# Patient Record
Sex: Male | Born: 1990 | Race: Black or African American | Hispanic: No | Marital: Single | Smoking: Current some day smoker
Health system: Southern US, Community
[De-identification: ages and names within clinical notes are randomized; demographics above are authoritative.]

## PROBLEM LIST (undated history)

## (undated) DIAGNOSIS — R0981 Nasal congestion: Secondary | ICD-10-CM

## (undated) DIAGNOSIS — B20 Human immunodeficiency virus [HIV] disease: Secondary | ICD-10-CM

---
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
## 2009-07-29 ENCOUNTER — Emergency Department (HOSPITAL_COMMUNITY): Admission: EM | Admit: 2009-07-29 | Discharge: 2009-07-29 | Payer: Self-pay | Admitting: Emergency Medicine

## 2010-01-15 ENCOUNTER — Emergency Department (HOSPITAL_COMMUNITY): Admission: EM | Admit: 2010-01-15 | Discharge: 2010-01-15 | Payer: Self-pay | Admitting: Emergency Medicine

## 2010-07-20 ENCOUNTER — Emergency Department (HOSPITAL_COMMUNITY): Admission: EM | Admit: 2010-07-20 | Discharge: 2010-07-20 | Payer: Self-pay | Admitting: Emergency Medicine

## 2011-06-08 LAB — URINALYSIS, ROUTINE W REFLEX MICROSCOPIC
Glucose, UA: NEGATIVE
Hgb urine dipstick: NEGATIVE
Ketones, ur: >80 — AB
Protein, ur: NEGATIVE

## 2014-07-11 ENCOUNTER — Encounter (HOSPITAL_COMMUNITY): Payer: Self-pay | Admitting: Emergency Medicine

## 2014-07-11 ENCOUNTER — Emergency Department (HOSPITAL_COMMUNITY): Payer: Federal, State, Local not specified - PPO

## 2014-07-11 ENCOUNTER — Encounter (HOSPITAL_COMMUNITY)
Admission: EM | Disposition: A | Discharge status: Home / Self Care | Payer: Self-pay | Source: Home / Self Care | Attending: Otolaryngology

## 2014-07-11 ENCOUNTER — Inpatient Hospital Stay (HOSPITAL_COMMUNITY): Payer: Federal, State, Local not specified - PPO | Admitting: Anesthesiology

## 2014-07-11 ENCOUNTER — Encounter (HOSPITAL_COMMUNITY): Payer: Federal, State, Local not specified - PPO | Admitting: Anesthesiology

## 2014-07-11 ENCOUNTER — Inpatient Hospital Stay (HOSPITAL_COMMUNITY)
Admission: EM | Admit: 2014-07-11 | Discharge: 2014-07-12 | DRG: 132 | Disposition: A | Discharge status: Home / Self Care | Payer: Federal, State, Local not specified - PPO | Attending: Otolaryngology | Admitting: Otolaryngology

## 2014-07-11 DIAGNOSIS — Z21 Asymptomatic human immunodeficiency virus [HIV] infection status: Secondary | ICD-10-CM | POA: Diagnosis present

## 2014-07-11 DIAGNOSIS — T1490 Injury, unspecified: Secondary | ICD-10-CM | POA: Diagnosis present

## 2014-07-11 DIAGNOSIS — S02609B Fracture of mandible, unspecified, initial encounter for open fracture: Secondary | ICD-10-CM | POA: Diagnosis present

## 2014-07-11 DIAGNOSIS — S52122A Displaced fracture of head of left radius, initial encounter for closed fracture: Secondary | ICD-10-CM

## 2014-07-11 DIAGNOSIS — F17210 Nicotine dependence, cigarettes, uncomplicated: Secondary | ICD-10-CM | POA: Diagnosis present

## 2014-07-11 DIAGNOSIS — T1490XA Injury, unspecified, initial encounter: Secondary | ICD-10-CM

## 2014-07-11 DIAGNOSIS — S02609A Fracture of mandible, unspecified, initial encounter for closed fracture: Secondary | ICD-10-CM | POA: Diagnosis present

## 2014-07-11 HISTORY — PX: CLOSED REDUCTION MANDIBLE: SHX5307

## 2014-07-11 HISTORY — DX: Human immunodeficiency virus (HIV) disease: B20

## 2014-07-11 LAB — BASIC METABOLIC PANEL
Anion gap: 15 (ref 5–15)
BUN: 10 mg/dL (ref 6–23)
CHLORIDE: 101 meq/L (ref 96–112)
CO2: 25 meq/L (ref 19–32)
Calcium: 9.9 mg/dL (ref 8.4–10.5)
Creatinine, Ser: 0.8 mg/dL (ref 0.50–1.35)
GFR calc non Af Amer: >90 mL/min (ref >90)
Glucose, Bld: 84 mg/dL (ref 70–99)
POTASSIUM: 4.1 meq/L (ref 3.7–5.3)
Sodium: 141 mEq/L (ref 137–147)

## 2014-07-11 LAB — I-STAT CHEM 8, ED
BUN: 9 mg/dL (ref 6–23)
CHLORIDE: 104 meq/L (ref 96–112)
Calcium, Ion: 1.19 mmol/L (ref 1.12–1.23)
Creatinine, Ser: 0.8 mg/dL (ref 0.50–1.35)
Glucose, Bld: 87 mg/dL (ref 70–99)
HEMATOCRIT: 49 % (ref 39.0–52.0)
HEMOGLOBIN: 16.7 g/dL (ref 13.0–17.0)
POTASSIUM: 4 meq/L (ref 3.7–5.3)
SODIUM: 142 meq/L (ref 137–147)
TCO2: 24 mmol/L (ref 0–100)

## 2014-07-11 LAB — CBC WITH DIFFERENTIAL/PLATELET
BASOS PCT: 0 % (ref 0–1)
Basophils Absolute: 0 10*3/uL (ref 0.0–0.1)
Eosinophils Absolute: 0 10*3/uL (ref 0.0–0.7)
Eosinophils Relative: 0 % (ref 0–5)
HEMATOCRIT: 45.4 % (ref 39.0–52.0)
HEMOGLOBIN: 15.5 g/dL (ref 13.0–17.0)
LYMPHS ABS: 1.2 10*3/uL (ref 0.7–4.0)
LYMPHS PCT: 26 % (ref 12–46)
MCH: 29.2 pg (ref 26.0–34.0)
MCHC: 34.1 g/dL (ref 30.0–36.0)
MCV: 85.7 fL (ref 78.0–100.0)
MONO ABS: 0.3 10*3/uL (ref 0.1–1.0)
MONOS PCT: 6 % (ref 3–12)
NEUTROS ABS: 3.2 10*3/uL (ref 1.7–7.7)
NEUTROS PCT: 68 % (ref 43–77)
Platelets: 214 10*3/uL (ref 150–400)
RBC: 5.3 MIL/uL (ref 4.22–5.81)
RDW: 12.8 % (ref 11.5–15.5)
WBC: 4.8 10*3/uL (ref 4.0–10.5)

## 2014-07-11 SURGERY — CLOSED REDUCTION, MANDIBLE
Anesthesia: General | Site: Mouth

## 2014-07-11 MED ORDER — FENTANYL CITRATE 0.05 MG/ML IJ SOLN
INTRAMUSCULAR | Status: AC
  Filled 2014-07-11: qty 5

## 2014-07-11 MED ORDER — LIDOCAINE-EPINEPHRINE 1 %-1:100000 IJ SOLN
INTRAMUSCULAR | Status: AC
  Filled 2014-07-11: qty 1

## 2014-07-11 MED ORDER — PROMETHAZINE HCL 25 MG RE SUPP: 25.0000 mg | Freq: Four times a day (QID) | RECTAL | Status: DC | PRN

## 2014-07-11 MED ORDER — PROMETHAZINE HCL 25 MG/ML IJ SOLN: 6.2500 mg | INTRAMUSCULAR | Status: DC | PRN

## 2014-07-11 MED ORDER — PROPOFOL 10 MG/ML IV BOLUS
INTRAVENOUS | Status: AC
  Filled 2014-07-11: qty 20

## 2014-07-11 MED ORDER — OXYMETAZOLINE HCL 0.05 % NA SOLN
NASAL | Status: DC | PRN
  Administered 2014-07-11 (×2): 1 via NASAL

## 2014-07-11 MED ORDER — TETANUS-DIPHTH-ACELL PERTUSSIS 5-2.5-18.5 LF-MCG/0.5 IM SUSP
0.5000 mL | Freq: Once | INTRAMUSCULAR | Status: AC
  Administered 2014-07-11: 0.5 mL via INTRAMUSCULAR
  Filled 2014-07-11: qty 0.5

## 2014-07-11 MED ORDER — PROPOFOL 10 MG/ML IV BOLUS
INTRAVENOUS | Status: DC | PRN
  Administered 2014-07-11: 200 mg via INTRAVENOUS

## 2014-07-11 MED ORDER — LIDOCAINE-EPINEPHRINE 1 %-1:100000 IJ SOLN
INTRAMUSCULAR | Status: DC | PRN
  Administered 2014-07-11: 20 mL

## 2014-07-11 MED ORDER — HYDROMORPHONE HCL 1 MG/ML IJ SOLN
0.2500 mg | INTRAMUSCULAR | Status: DC | PRN
  Administered 2014-07-11 (×4): 0.5 mg via INTRAVENOUS

## 2014-07-11 MED ORDER — HYDROMORPHONE HCL 1 MG/ML IJ SOLN
1.0000 mg | Freq: Once | INTRAMUSCULAR | Status: AC
  Administered 2014-07-11: 1 mg via INTRAVENOUS
  Filled 2014-07-11: qty 1

## 2014-07-11 MED ORDER — NEOSTIGMINE METHYLSULFATE 10 MG/10ML IV SOLN
INTRAVENOUS | Status: AC
  Filled 2014-07-11: qty 1

## 2014-07-11 MED ORDER — ONDANSETRON HCL 4 MG/2ML IJ SOLN
INTRAMUSCULAR | Status: AC
  Filled 2014-07-11: qty 2

## 2014-07-11 MED ORDER — LIDOCAINE HCL (CARDIAC) 20 MG/ML IV SOLN
INTRAVENOUS | Status: AC
  Filled 2014-07-11: qty 5

## 2014-07-11 MED ORDER — DEXAMETHASONE SODIUM PHOSPHATE 4 MG/ML IJ SOLN
INTRAMUSCULAR | Status: DC | PRN
  Administered 2014-07-11: 8 mg via INTRAVENOUS

## 2014-07-11 MED ORDER — MIDAZOLAM HCL 5 MG/5ML IJ SOLN
INTRAMUSCULAR | Status: DC | PRN
  Administered 2014-07-11 (×2): 1 mg via INTRAVENOUS

## 2014-07-11 MED ORDER — ARTIFICIAL TEARS OP OINT
TOPICAL_OINTMENT | OPHTHALMIC | Status: DC | PRN
  Administered 2014-07-11: 1 via OPHTHALMIC

## 2014-07-11 MED ORDER — LIDOCAINE VISCOUS 2 % MT SOLN
15.0000 mL | OROMUCOSAL | Status: AC
  Administered 2014-07-11: 15 mL via OROMUCOSAL
  Filled 2014-07-11 (×2): qty 15

## 2014-07-11 MED ORDER — CLINDAMYCIN PALMITATE HCL 75 MG/5ML PO SOLR
300.0000 mg | Freq: Three times a day (TID) | ORAL | Status: DC
  Administered 2014-07-11 – 2014-07-12 (×3): 300 mg via ORAL
  Filled 2014-07-11 (×9): qty 20

## 2014-07-11 MED ORDER — SUCCINYLCHOLINE CHLORIDE 20 MG/ML IJ SOLN
INTRAMUSCULAR | Status: AC
  Filled 2014-07-11: qty 1

## 2014-07-11 MED ORDER — CLINDAMYCIN HCL 300 MG PO CAPS: 300.0000 mg | ORAL_CAPSULE | Freq: Three times a day (TID) | ORAL | Status: DC

## 2014-07-11 MED ORDER — HYDROMORPHONE HCL 1 MG/ML IJ SOLN
INTRAMUSCULAR | Status: AC
  Administered 2014-07-11: 0.5 mg via INTRAVENOUS
  Filled 2014-07-11: qty 1

## 2014-07-11 MED ORDER — DEXTROSE-NACL 5-0.9 % IV SOLN
INTRAVENOUS | Status: DC
  Administered 2014-07-11 – 2014-07-12 (×2): via INTRAVENOUS

## 2014-07-11 MED ORDER — FENTANYL CITRATE 0.05 MG/ML IJ SOLN
INTRAMUSCULAR | Status: DC | PRN
  Administered 2014-07-11 (×2): 50 ug via INTRAVENOUS
  Administered 2014-07-11: 150 ug via INTRAVENOUS

## 2014-07-11 MED ORDER — OXYMETAZOLINE HCL 0.05 % NA SOLN: 2.0000 | NASAL | Status: DC

## 2014-07-11 MED ORDER — ONDANSETRON HCL 4 MG/2ML IJ SOLN
INTRAMUSCULAR | Status: DC | PRN
  Administered 2014-07-11: 4 mg via INTRAVENOUS

## 2014-07-11 MED ORDER — HYDROCODONE-ACETAMINOPHEN 7.5-325 MG/15ML PO SOLN: 15.0000 mL | Freq: Four times a day (QID) | ORAL | Status: DC | PRN

## 2014-07-11 MED ORDER — ONDANSETRON HCL 4 MG/2ML IJ SOLN
4.0000 mg | Freq: Once | INTRAMUSCULAR | Status: AC
  Administered 2014-07-11: 4 mg via INTRAVENOUS
  Filled 2014-07-11: qty 2

## 2014-07-11 MED ORDER — CLINDAMYCIN PHOSPHATE 900 MG/50ML IV SOLN
900.0000 mg | Freq: Once | INTRAVENOUS | Status: AC
  Administered 2014-07-11: 900 mg via INTRAVENOUS
  Filled 2014-07-11: qty 50

## 2014-07-11 MED ORDER — LACTATED RINGERS IV SOLN
INTRAVENOUS | Status: DC | PRN
  Administered 2014-07-11: 08:00:00 via INTRAVENOUS

## 2014-07-11 MED ORDER — OXYCODONE HCL 5 MG/5ML PO SOLN: 5.0000 mg | Freq: Once | ORAL | Status: DC | PRN

## 2014-07-11 MED ORDER — PROMETHAZINE HCL 25 MG PO TABS: 25.0000 mg | ORAL_TABLET | Freq: Four times a day (QID) | ORAL | Status: DC | PRN

## 2014-07-11 MED ORDER — DEXAMETHASONE SODIUM PHOSPHATE 4 MG/ML IJ SOLN
INTRAMUSCULAR | Status: AC
  Filled 2014-07-11: qty 2

## 2014-07-11 MED ORDER — OXYCODONE HCL 5 MG PO TABS: 5.0000 mg | ORAL_TABLET | Freq: Once | ORAL | Status: DC | PRN

## 2014-07-11 MED ORDER — MIDAZOLAM HCL 2 MG/2ML IJ SOLN
INTRAMUSCULAR | Status: AC
  Filled 2014-07-11: qty 2

## 2014-07-11 MED ORDER — SCOPOLAMINE 1 MG/3DAYS TD PT72
1.0000 | MEDICATED_PATCH | TRANSDERMAL | Status: DC
  Administered 2014-07-11: 1.5 mg via TRANSDERMAL
  Filled 2014-07-11: qty 1

## 2014-07-11 MED ORDER — LIDOCAINE HCL (CARDIAC) 20 MG/ML IV SOLN
INTRAVENOUS | Status: DC | PRN
  Administered 2014-07-11: 100 mg via INTRATRACHEAL

## 2014-07-11 MED ORDER — MORPHINE SULFATE 4 MG/ML IJ SOLN
4.0000 mg | Freq: Once | INTRAMUSCULAR | Status: AC
  Administered 2014-07-11: 4 mg via INTRAVENOUS
  Filled 2014-07-11: qty 1

## 2014-07-11 MED ORDER — SCOPOLAMINE 1 MG/3DAYS TD PT72
MEDICATED_PATCH | TRANSDERMAL | Status: AC
  Administered 2014-07-11: 1 via TRANSDERMAL
  Filled 2014-07-11: qty 1

## 2014-07-11 MED ORDER — OXYMETAZOLINE HCL 0.05 % NA SOLN
NASAL | Status: AC
  Filled 2014-07-11: qty 15

## 2014-07-11 MED ORDER — CLINDAMYCIN PHOSPHATE 900 MG/50ML IV SOLN
INTRAVENOUS | Status: AC
  Administered 2014-07-11: 900 mg via INTRAVENOUS
  Filled 2014-07-11: qty 50

## 2014-07-11 MED ORDER — SUCCINYLCHOLINE CHLORIDE 20 MG/ML IJ SOLN
INTRAMUSCULAR | Status: DC | PRN
  Administered 2014-07-11: 140 mg via INTRAVENOUS

## 2014-07-11 MED ORDER — NEOMYCIN-POLYMYXIN-DEXAMETH 3.5-10000-0.1 OP OINT
TOPICAL_OINTMENT | OPHTHALMIC | Status: AC
  Filled 2014-07-11: qty 3.5

## 2014-07-11 MED ORDER — HYDROCODONE-ACETAMINOPHEN 7.5-325 MG/15ML PO SOLN
10.0000 mL | ORAL | Status: DC | PRN
  Administered 2014-07-11 – 2014-07-12 (×5): 15 mL via ORAL
  Filled 2014-07-11 (×5): qty 15

## 2014-07-11 MED ORDER — GLYCOPYRROLATE 0.2 MG/ML IJ SOLN
INTRAMUSCULAR | Status: AC
  Filled 2014-07-11: qty 3

## 2014-07-11 MED ORDER — ROCURONIUM BROMIDE 50 MG/5ML IV SOLN
INTRAVENOUS | Status: AC
  Filled 2014-07-11: qty 1

## 2014-07-11 SURGICAL SUPPLY — 22 items
CANISTER SUCTION 2500CC (MISCELLANEOUS) ×3 IMPLANT
COVER SURGICAL LIGHT HANDLE (MISCELLANEOUS) ×3 IMPLANT
DECANTER SPIKE VIAL GLASS SM (MISCELLANEOUS) ×3 IMPLANT
DRAPE PROXIMA HALF (DRAPES) ×3 IMPLANT
GAUZE SPONGE 4X4 16PLY XRAY LF (GAUZE/BANDAGES/DRESSINGS) IMPLANT
GLOVE ECLIPSE 7.5 STRL STRAW (GLOVE) ×3 IMPLANT
GOWN STRL REUS W/ TWL LRG LVL3 (GOWN DISPOSABLE) ×2 IMPLANT
GOWN STRL REUS W/TWL LRG LVL3 (GOWN DISPOSABLE) ×4
KIT BASIN OR (CUSTOM PROCEDURE TRAY) ×3 IMPLANT
KIT ROOM TURNOVER OR (KITS) ×3 IMPLANT
NEEDLE 27GAX1X1/2 (NEEDLE) ×3 IMPLANT
NS IRRIG 1000ML POUR BTL (IV SOLUTION) ×3 IMPLANT
PAD ARMBOARD 7.5X6 YLW CONV (MISCELLANEOUS) ×6 IMPLANT
SCISSORS WIRE ANG 4 3/4 DISP (INSTRUMENTS) ×3 IMPLANT
SUCTION FRAZIER TIP 10 FR DISP (SUCTIONS) IMPLANT
SUT CHROMIC 3 0 PS 2 (SUTURE) ×3 IMPLANT
SUT CHROMIC 4 0 PS 2 18 (SUTURE) ×3 IMPLANT
TRAY ENT MC OR (CUSTOM PROCEDURE TRAY) ×3 IMPLANT
TUBE CONNECTING 12'X1/4 (SUCTIONS) ×1
TUBE CONNECTING 12X1/4 (SUCTIONS) ×2 IMPLANT
WATER STERILE IRR 1000ML POUR (IV SOLUTION) ×3 IMPLANT
YANKAUER SUCT BULB TIP NO VENT (SUCTIONS) ×3 IMPLANT
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
## 2014-07-11 NOTE — Progress Notes (Signed)
Admitted to PACU. Resp WNL no nausea . [facility]
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
## 2014-07-11 NOTE — Op Note (Signed)
OPERATIVE REPORT  DATE OF SURGERY: 07/11/2014  PATIENT:  Charles LevinsWilliam Greener,  23 y.o. male   PRE-OPERATIVE DIAGNOSIS: MANDIBLE FX   POST-OPERATIVE DIAGNOSIS: MANDIBLE FX   PROCEDURE: Procedure(s):   Maxillomandibular fixation treatment of MANDIBULAR FRACTURE   SURGEON: Susy FrizzleJefry H Elvi Leventhal, MD   ASSISTANTS: None   ANESTHESIA: General   EBL: 20 ml   DRAINS: None   LOCAL MEDICATIONS USED: None   SPECIMEN: none   COUNTS: Correct   PROCEDURE DETAILS:  The patient was taken to the operating room and placed on the operating table in the supine position. Following induction of general endotracheal anesthesia using a nasal endotracheal tube, the patient was prepped and draped in a standard fashion. A cheek retractor was used throughout the case. The anterior mandibular fracture was easily identified by mobilizing the 2 sides. The patient was brought back into normal occlusion. Hybrid arch bars were placed upper and lower using 8 mm screws to secure them in place, taking care to avoid tooth root. 22-gauge wire loops were then placed 2 on each side to keep the occlusion in what appeared to be normal position. There was good stability. The oral cavity was rinsed with saline and suctioned. The patient was awakened, extubated and transferred to PACU in stable condition.   PATIENT DISPOSITION: To PACU, stable

## 2014-07-11 NOTE — Discharge Instructions (Signed)
Mandibular Fracture °A mandibular fracture is a break in the jawbone. °CAUSES  °The most common cause of mandibular fracture is a direct blow (trauma) to the jaw. This could happen from: °· A car crash. °· Physical violence. °· A fall from a high place. °SYMPTOMS  °· Pain. °· Swelling. °· Difficulty and pain when closing the mouth. °· Feeling that the teeth are not aligned properly when closing the mouth (malocclusion). °· Difficulty speaking. °· Difficulty swallowing. °DIAGNOSIS  °Your caregiver will take your history and perform a physical exam. He or she may also order imaging tests, such as X-rays or a computed tomography (CT) scan, to confirm your diagnosis. °TREATMENT  °Surgery is often needed to put the jaw back in the right position. Wires are usually placed around the teeth to hold the jaw in place while it heals. Treatment may also include pain medicine, ice, and a soft or liquid diet. °HOME CARE INSTRUCTIONS  °· Put ice on the injured area. °¨ Put ice in a plastic bag. °¨ Place a towel between your skin and the bag. °¨ Leave the ice on for 15-20 minutes, 03-04 times a day for the first 2 days. °· Only take over-the-counter or prescription medicines for pain, discomfort, or fever as directed by your caregiver. °· Eat a well-balanced, high-protein soft or liquid diet as directed by your caregiver. °· If your jaws are wired, follow your caregiver's instructions for wired jaw care. °· Sleep on your back to avoid putting pressure on your jaw. °· Avoid exercising to the point that you become short of breath. °SEEK MEDICAL CARE IF:  °· You have a severe headache or numbness in your face. °· You have severe jaw pain that is not relieved with medicine. °· Your jaw wires become loose. °· You have uncontrollable nausea or anxiety. °· Your swelling or redness gets worse. °SEEK IMMEDIATE MEDICAL CARE IF: °· You have a fever. °· You have difficulty breathing. °· You feel like your airway is tightening. °· You cannot  swallow your saliva. °· You make a high-pitched whistling sound when you breathe (wheezing). °MAKE SURE YOU:  °· Understand these instructions. °· Will watch your condition. °· Will get help right away if you are not doing well or get worse. °Document Released: 09/03/2005 Document Revised: 11/26/2011 Document Reviewed: 09/19/2011 °[employer]® Patient Information ©2015 [employer]. This information is not intended to replace advice given to you by your health care provider. Make sure you discuss any questions you have with your health care provider. ° °

## 2014-07-11 NOTE — Anesthesia Procedure Notes (Signed)
Procedure Name: Intubation Date/Time: 07/11/2014 8:48 AM Performed by: Edmonia CaprioAUSTON, Lotta Frankenfield M Pre-anesthesia Checklist: Patient identified, Emergency Drugs available, Suction available, Patient being monitored and Timeout performed Patient Re-evaluated:Patient Re-evaluated prior to inductionOxygen Delivery Method: Circle system utilized Intubation Type: IV induction and Rapid sequence Laryngoscope Size: Miller and 2 Grade View: Grade I Tube type: ETT softened with warm sterile water prior to insertion. Nasal Tubes: Right, Nasal Rae and Nasal prep performed Tube size: 7.5 mm Number of attempts: 1 Placement Confirmation: ETT inserted through vocal cords under direct vision,  positive ETCO2,  CO2 detector and breath sounds checked- equal and bilateral Tube secured with: Tape Dental Injury: Teeth and Oropharynx as per pre-operative assessment and Bloody posterior oropharynx

## 2014-07-11 NOTE — Transfer of Care (Signed)
Immediate Anesthesia Transfer of Care Note  Patient: Charles LevinsWilliam Kennedy  Procedure(s) Performed: Procedure(s): CLOSED REDUCTION MANDIBULAR WITH ARCH BARS PLACEMENT (N/A)  Patient Location: PACU  Anesthesia Type:General  Level of Consciousness: sedated  Airway & Oxygen Therapy: Patient Spontanous Breathing and Patient connected to face mask oxygen  Post-op Assessment: Report given to PACU RN, Post -op Vital signs reviewed and stable and Patient moving all extremities  Post vital signs: Reviewed and stable  Complications: No apparent anesthesia complications

## 2014-07-11 NOTE — Anesthesia Preprocedure Evaluation (Addendum)
Anesthesia Evaluation  Patient identified by MRN, date of birth, ID band Patient awake    Reviewed: Allergy & Precautions, H&P , NPO status , Patient's Chart, lab work & pertinent test results  History of Anesthesia Complications Negative for: history of anesthetic complications  Airway Mallampati: III TM Distance: >3 FB   Mouth opening: Limited Mouth Opening  Dental  (+) Teeth Intact, Dental Advisory Given   Pulmonary Current Smoker,    Pulmonary exam normal       Cardiovascular negative cardio ROS      Neuro/Psych negative neurological ROS  negative psych ROS   GI/Hepatic negative GI ROS, Neg liver ROS,   Endo/Other  negative endocrine ROS  Renal/GU negative Renal ROS     Musculoskeletal   Abdominal   Peds  Hematology  (+) HIV,   Anesthesia Other Findings   Reproductive/Obstetrics                          Anesthesia Physical Anesthesia Plan  ASA: II  Anesthesia Plan: General   Post-op Pain Management:    Induction: Intravenous  Airway Management Planned: Nasal ETT  Additional Equipment:   Intra-op Plan:   Post-operative Plan: Extubation in OR  Informed Consent: I have reviewed the patients History and Physical, chart, labs and discussed the procedure including the risks, benefits and alternatives for the proposed anesthesia with the patient or authorized representative who has indicated his/her understanding and acceptance.   Dental advisory given  Plan Discussed with:   Anesthesia Plan Comments:        Anesthesia Quick Evaluation

## 2014-07-11 NOTE — Progress Notes (Signed)
Small amt bloody oozing from RT nares. Pt had RT nasal intubation for surgery . Dr. Krista BlueSinger  aware.

## 2014-07-11 NOTE — ED Notes (Signed)
Patient transported to CT 
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
## 2014-07-11 NOTE — Progress Notes (Signed)
Orthopedic Tech Progress Note Patient Details:  Corwin LevinsWilliam Peyser 1991-01-16 [id number] LAS applied to LUE. Tolerated well. Arm sling applied. Ortho Devices Type of Ortho Device: Arm sling;Post (long arm) splint Ortho Device/Splint Location: LUE Ortho Device/Splint Interventions: Application   Asia R Thompson 07/11/2014, 10:30 AM
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
## 2014-07-11 NOTE — Anesthesia Postprocedure Evaluation (Signed)
Anesthesia Post Note  Patient: Charles LevinsWilliam Kennedy  Procedure(s) Performed: Procedure(s) (LRB): CLOSED REDUCTION MANDIBULAR WITH ARCH BARS PLACEMENT (N/A)  Anesthesia type: general  Patient location: PACU  Post pain: Pain level controlled  Post assessment: Patient's Cardiovascular Status Stable  Last Vitals:  Filed Vitals:   07/11/14 1015  BP: 147/90  Pulse: 59  Temp:   Resp: 17    Post vital signs: Reviewed and stable  Level of consciousness: sedated  Complications: No apparent anesthesia complications

## 2014-07-11 NOTE — ED Notes (Signed)
[facility]  At bedside.

## 2014-07-12 MED ORDER — INFLUENZA VAC SPLIT QUAD 0.5 ML IM SUSY: 0.5000 mL | PREFILLED_SYRINGE | INTRAMUSCULAR | Status: DC

## 2014-07-12 NOTE — Discharge Summary (Signed)
  Physician Discharge Summary  Patient ID: Charles LevinsWilliam Misiaszek MRN: [id number] DOB/AGE: [birth year] 25 y.o.  Admit date: 07/11/2014 Discharge date: 07/12/2014  Admission Diagnoses: Mandible fracture  Discharge Diagnoses:  Active Problems:   Mandibular fracture   Multiple sites of mandible open fracture   Discharged Condition: good  Hospital Course: No complications  Consults: none  Significant Diagnostic Studies: none  Treatments: surgery: Maxillomandibular fixation for stabilization of mandible fracture  Discharge Exam: Blood pressure 108/55, pulse 51, temperature 97.6 F (36.4 C), temperature source Axillary, resp. rate 14, height 5\' 11"  (1.803 m), weight 61.236 kg (135 lb), SpO2 100.00%. PHYSICAL EXAM: MMF stable and in place. No problems with breathing or swallowing.  Disposition: Final discharge disposition not confirmed     Medication List         clindamycin 300 MG capsule  Commonly known as:  CLEOCIN  Take 1 capsule (300 mg total) by mouth 3 (three) times daily.     HYDROcodone-acetaminophen 7.5-325 mg/15 ml solution  Commonly known as:  HYCET  Take 15 mLs by mouth 4 (four) times daily as needed for moderate pain.     promethazine 25 MG suppository  Commonly known as:  PHENERGAN  Place 1 suppository (25 mg total) rectally every 6 (six) hours as needed for nausea or vomiting.           Follow-up Information   Follow up with [name], [name], MD.   Specialty:  Otolaryngology   Contact information:   [address] [place] [address] [phone]       Signed: Serena ColonelROSEN, [name] 07/12/2014, 11:07 AM

## 2014-07-12 NOTE — Care Management Note (Signed)
    Page 1 of 1   07/12/2014     5:48:26 PM CARE MANAGEMENT NOTE 07/12/2014  Patient:  Charles Kennedy,Charles Kennedy   Account Number:  [id number]  Date Initiated:  07/12/2014  Documentation initiated by:  Letha CapeAYLOR,Navarre Diana  Subjective/Objective Assessment:   dx mandibular fx  admit- from home     Action/Plan:   Anticipated DC Date:  07/12/2014   Anticipated DC Plan:  HOME/SELF CARE      DC Planning Services  CM consult      Choice offered to / List presented to:             Status of service:  Completed, signed off Medicare Important Message given?   (If response is "NO", the following Medicare IM given date fields will be blank) Date Medicare IM given:   Medicare IM given by:   Date Additional Medicare IM given:   Additional Medicare IM given by:    Discharge Disposition:  HOME/SELF CARE  Per UR Regulation:  Reviewed for med. necessity/level of care/duration of stay  If discussed at Long Length of Stay Meetings, dates discussed:    Comments:

## 2014-07-12 NOTE — Progress Notes (Signed)
Utilization review completed.  

## 2014-07-12 NOTE — Progress Notes (Signed)
Charles LevinsWilliam Krolikowski to be D/C'd Home per MD order.  Discussed with the patient and all questions fully answered.    Medication List         clindamycin 300 MG capsule  Commonly known as:  CLEOCIN  Take 1 capsule (300 mg total) by mouth 3 (three) times daily.     HYDROcodone-acetaminophen 7.5-325 mg/15 ml solution  Commonly known as:  HYCET  Take 15 mLs by mouth 4 (four) times daily as needed for moderate pain.     promethazine 25 MG suppository  Commonly known as:  PHENERGAN  Place 1 suppository (25 mg total) rectally every 6 (six) hours as needed for nausea or vomiting.        VVS, Skin clean, dry and intact without evidence of skin break down, no evidence of skin tears noted. IV catheter discontinued intact. Site without signs and symptoms of complications. Dressing and pressure applied.  An After Visit Summary was printed and given to the patient.  D/c education completed with patient/family including follow up instructions, medication list, d/c activities limitations if indicated, with other d/c instructions as indicated by MD - patient able to verbalize understanding, all questions fully answered.   Patient instructed to return to ED, call 911, or call MD for any changes in condition.   Patient escorted via WC, and D/C home via private auto.  [name] D 07/12/2014 12:35 PM

## 2014-07-13 ENCOUNTER — Encounter (HOSPITAL_COMMUNITY): Payer: Self-pay | Admitting: Otolaryngology

## 2014-08-16 ENCOUNTER — Encounter (HOSPITAL_BASED_OUTPATIENT_CLINIC_OR_DEPARTMENT_OTHER): Payer: Self-pay | Admitting: *Deleted

## 2014-08-16 DIAGNOSIS — R0981 Nasal congestion: Secondary | ICD-10-CM

## 2014-08-16 HISTORY — DX: Nasal congestion: R09.81

## 2014-08-19 ENCOUNTER — Ambulatory Visit: Payer: Self-pay | Admitting: Otolaryngology

## 2014-08-19 NOTE — H&P (Signed)
  Assessment  Fracture of multiple sites of mandible, with routine healing, subsequent encounter (V54.19) (S02.609D). Reason For Visit  Follow up from mandible fracture. Discussed  Doing great, MMF is stable and secure. Recommend removal in 2 weeks. Allergies  Penicillins. Current Meds  Hydrocodone-Acetaminophen TABS;; RPT Atripla TABS;; RPT. Active Problems  Fracture of multiple sites of mandible, with routine healing, subsequent encounter   (V54.19) (S02.609D) HIV infection   (V08) (Z21) Radial head fracture   (813.05) (S52.123A). PSH  Jaw Surgery;  * mandible fx, 20 Jul 2014. Signature  Electronically signed by : Serena ColonelJefry  Royce Sciara  M.D.; 08/06/2014 1:49 PM EST.

## 2014-08-20 ENCOUNTER — Encounter (HOSPITAL_BASED_OUTPATIENT_CLINIC_OR_DEPARTMENT_OTHER)
Admission: RE | Disposition: A | Discharge status: Home / Self Care | Payer: Self-pay | Source: Ambulatory Visit | Attending: Otolaryngology

## 2014-08-20 ENCOUNTER — Ambulatory Visit (HOSPITAL_BASED_OUTPATIENT_CLINIC_OR_DEPARTMENT_OTHER): Payer: Federal, State, Local not specified - PPO | Admitting: Anesthesiology

## 2014-08-20 ENCOUNTER — Encounter (HOSPITAL_BASED_OUTPATIENT_CLINIC_OR_DEPARTMENT_OTHER): Payer: Self-pay | Admitting: *Deleted

## 2014-08-20 ENCOUNTER — Ambulatory Visit (HOSPITAL_BASED_OUTPATIENT_CLINIC_OR_DEPARTMENT_OTHER)
Admission: RE | Admit: 2014-08-20 | Discharge: 2014-08-20 | Disposition: A | Discharge status: Home / Self Care | Payer: Federal, State, Local not specified - PPO | Source: Ambulatory Visit | Attending: Otolaryngology | Admitting: Otolaryngology

## 2014-08-20 DIAGNOSIS — S0269XD Fracture of mandible of other specified site, subsequent encounter for fracture with routine healing: Secondary | ICD-10-CM | POA: Diagnosis not present

## 2014-08-20 DIAGNOSIS — Y998 Other external cause status: Secondary | ICD-10-CM | POA: Insufficient documentation

## 2014-08-20 DIAGNOSIS — Z880 Allergy status to penicillin: Secondary | ICD-10-CM | POA: Insufficient documentation

## 2014-08-20 DIAGNOSIS — Y9289 Other specified places as the place of occurrence of the external cause: Secondary | ICD-10-CM | POA: Diagnosis not present

## 2014-08-20 DIAGNOSIS — Z21 Asymptomatic human immunodeficiency virus [HIV] infection status: Secondary | ICD-10-CM | POA: Diagnosis not present

## 2014-08-20 DIAGNOSIS — F17210 Nicotine dependence, cigarettes, uncomplicated: Secondary | ICD-10-CM | POA: Insufficient documentation

## 2014-08-20 DIAGNOSIS — X58XXXA Exposure to other specified factors, initial encounter: Secondary | ICD-10-CM | POA: Insufficient documentation

## 2014-08-20 DIAGNOSIS — Y9389 Activity, other specified: Secondary | ICD-10-CM | POA: Diagnosis not present

## 2014-08-20 HISTORY — PX: MANDIBULAR HARDWARE REMOVAL: SHX5205

## 2014-08-20 HISTORY — DX: Nasal congestion: R09.81

## 2014-08-20 SURGERY — REMOVAL, HARDWARE, MANDIBLE
Anesthesia: Monitor Anesthesia Care | Site: Mouth

## 2014-08-20 MED ORDER — HYDROMORPHONE HCL 1 MG/ML IJ SOLN
0.2500 mg | INTRAMUSCULAR | Status: DC | PRN
  Administered 2014-08-20 (×2): 0.5 mg via INTRAVENOUS

## 2014-08-20 MED ORDER — BUPIVACAINE HCL (PF) 0.5 % IJ SOLN
INTRAMUSCULAR | Status: AC
  Filled 2014-08-20: qty 30

## 2014-08-20 MED ORDER — FENTANYL CITRATE 0.05 MG/ML IJ SOLN
INTRAMUSCULAR | Status: DC | PRN
  Administered 2014-08-20: 100 ug via INTRAVENOUS

## 2014-08-20 MED ORDER — ONDANSETRON HCL 4 MG/2ML IJ SOLN: 4.0000 mg | Freq: Once | INTRAMUSCULAR | Status: DC | PRN

## 2014-08-20 MED ORDER — LIDOCAINE HCL (PF) 1 % IJ SOLN
INTRAMUSCULAR | Status: AC
  Filled 2014-08-20: qty 30

## 2014-08-20 MED ORDER — MIDAZOLAM HCL 5 MG/5ML IJ SOLN
INTRAMUSCULAR | Status: DC | PRN
  Administered 2014-08-20: 2 mg via INTRAVENOUS

## 2014-08-20 MED ORDER — HYDROMORPHONE HCL 1 MG/ML IJ SOLN
INTRAMUSCULAR | Status: AC
  Filled 2014-08-20: qty 1

## 2014-08-20 MED ORDER — LIDOCAINE-EPINEPHRINE 1 %-1:100000 IJ SOLN
INTRAMUSCULAR | Status: DC | PRN
  Administered 2014-08-20: 3 mL

## 2014-08-20 MED ORDER — BACITRACIN 500 UNIT/GM EX OINT
TOPICAL_OINTMENT | CUTANEOUS | Status: DC | PRN
  Administered 2014-08-20: 1 via TOPICAL

## 2014-08-20 MED ORDER — ONDANSETRON HCL 4 MG/2ML IJ SOLN
INTRAMUSCULAR | Status: DC | PRN
  Administered 2014-08-20: 4 mg via INTRAVENOUS

## 2014-08-20 MED ORDER — OXYCODONE HCL 5 MG/5ML PO SOLN
5.0000 mg | Freq: Once | ORAL | Status: AC | PRN
Start: 2014-08-20 — End: 2014-08-20

## 2014-08-20 MED ORDER — LACTATED RINGERS IV SOLN
INTRAVENOUS | Status: DC
  Administered 2014-08-20: 09:00:00 via INTRAVENOUS

## 2014-08-20 MED ORDER — MIDAZOLAM HCL 2 MG/2ML IJ SOLN: 1.0000 mg | INTRAMUSCULAR | Status: DC | PRN

## 2014-08-20 MED ORDER — HYDROCODONE-ACETAMINOPHEN 10-325 MG PO TABS: 1.0000 | ORAL_TABLET | Freq: Four times a day (QID) | ORAL | Status: AC | PRN

## 2014-08-20 MED ORDER — DEXAMETHASONE SODIUM PHOSPHATE 4 MG/ML IJ SOLN
INTRAMUSCULAR | Status: DC | PRN
  Administered 2014-08-20: 10 mg via INTRAVENOUS

## 2014-08-20 MED ORDER — MIDAZOLAM HCL 2 MG/2ML IJ SOLN
INTRAMUSCULAR | Status: AC
  Filled 2014-08-20: qty 2

## 2014-08-20 MED ORDER — PROPOFOL 10 MG/ML IV EMUL
INTRAVENOUS | Status: AC
  Filled 2014-08-20: qty 50

## 2014-08-20 MED ORDER — PROPOFOL 10 MG/ML IV BOLUS
INTRAVENOUS | Status: DC | PRN
  Administered 2014-08-20: 100 mg via INTRAVENOUS

## 2014-08-20 MED ORDER — PROMETHAZINE HCL 25 MG RE SUPP
25.0000 mg | Freq: Four times a day (QID) | RECTAL | Status: AC | PRN
Start: 2014-08-20 — End: ?

## 2014-08-20 MED ORDER — PROPOFOL INFUSION 10 MG/ML OPTIME
INTRAVENOUS | Status: DC | PRN
  Administered 2014-08-20: 180 ug/kg/min via INTRAVENOUS

## 2014-08-20 MED ORDER — OXYCODONE HCL 5 MG PO TABS
5.0000 mg | ORAL_TABLET | Freq: Once | ORAL | Status: AC | PRN
  Administered 2014-08-20: 5 mg via ORAL

## 2014-08-20 MED ORDER — LIDOCAINE-EPINEPHRINE 1 %-1:100000 IJ SOLN
INTRAMUSCULAR | Status: AC
  Filled 2014-08-20: qty 1

## 2014-08-20 MED ORDER — FENTANYL CITRATE 0.05 MG/ML IJ SOLN
INTRAMUSCULAR | Status: AC
  Filled 2014-08-20: qty 2

## 2014-08-20 MED ORDER — BACITRACIN ZINC 500 UNIT/GM EX OINT
TOPICAL_OINTMENT | CUTANEOUS | Status: AC
  Filled 2014-08-20: qty 0.9

## 2014-08-20 MED ORDER — OXYCODONE HCL 5 MG PO TABS
ORAL_TABLET | ORAL | Status: AC
  Filled 2014-08-20: qty 1

## 2014-08-20 MED ORDER — CLINDAMYCIN HCL 300 MG PO CAPS: 300.0000 mg | ORAL_CAPSULE | Freq: Three times a day (TID) | ORAL | Status: AC

## 2014-08-20 MED ORDER — MIDAZOLAM HCL 2 MG/ML PO SYRP: 12.0000 mg | ORAL_SOLUTION | Freq: Once | ORAL | Status: DC | PRN

## 2014-08-20 MED ORDER — MEPERIDINE HCL 25 MG/ML IJ SOLN: 6.2500 mg | INTRAMUSCULAR | Status: DC | PRN

## 2014-08-20 MED ORDER — FENTANYL CITRATE 0.05 MG/ML IJ SOLN: 50.0000 ug | INTRAMUSCULAR | Status: DC | PRN

## 2014-08-20 SURGICAL SUPPLY — 28 items
BLADE SURG 15 STRL LF DISP TIS (BLADE) ×1 IMPLANT
BLADE SURG 15 STRL SS (BLADE) ×2
CANISTER SUCT 1200ML W/VALVE (MISCELLANEOUS) ×3 IMPLANT
COVER MAYO STAND STRL (DRAPES) ×3 IMPLANT
DECANTER SPIKE VIAL GLASS SM (MISCELLANEOUS) ×3 IMPLANT
ELECT COATED BLADE 2.86 ST (ELECTRODE) IMPLANT
ELECT REM PT RETURN 9FT ADLT (ELECTROSURGICAL)
ELECTRODE REM PT RTRN 9FT ADLT (ELECTROSURGICAL) IMPLANT
GAUZE SPONGE 4X4 16PLY XRAY LF (GAUZE/BANDAGES/DRESSINGS) IMPLANT
GLOVE ECLIPSE 6.5 STRL STRAW (GLOVE) ×3 IMPLANT
GLOVE ECLIPSE 7.5 STRL STRAW (GLOVE) ×3 IMPLANT
GOWN STRL REUS W/ TWL LRG LVL3 (GOWN DISPOSABLE) ×1 IMPLANT
GOWN STRL REUS W/TWL LRG LVL3 (GOWN DISPOSABLE) ×2
MARKER SKIN DUAL TIP RULER LAB (MISCELLANEOUS) IMPLANT
NEEDLE 27GAX1X1/2 (NEEDLE) ×3 IMPLANT
NS IRRIG 1000ML POUR BTL (IV SOLUTION) IMPLANT
PACK BASIN DAY SURGERY FS (CUSTOM PROCEDURE TRAY) ×3 IMPLANT
PENCIL FOOT CONTROL (ELECTRODE) IMPLANT
SCISSORS WIRE ANG 4 3/4 DISP (INSTRUMENTS) IMPLANT
SHEET MEDIUM DRAPE 40X70 STRL (DRAPES) ×3 IMPLANT
SUT CHROMIC 3 0 PS 2 (SUTURE) IMPLANT
SUT CHROMIC 4 0 PS 2 18 (SUTURE) IMPLANT
SYR CONTROL 10ML LL (SYRINGE) ×3 IMPLANT
TOWEL OR 17X24 6PK STRL BLUE (TOWEL DISPOSABLE) ×6 IMPLANT
TRAY DSU PREP LF (CUSTOM PROCEDURE TRAY) IMPLANT
TUBE CONNECTING 20'X1/4 (TUBING) ×1
TUBE CONNECTING 20X1/4 (TUBING) ×2 IMPLANT
YANKAUER SUCT BULB TIP NO VENT (SUCTIONS) ×3 IMPLANT

## 2014-08-20 NOTE — Interval H&P Note (Signed)
History and Physical Interval Note:  08/20/2014 8:49 AM  Charles Kennedy  has presented today for surgery, with the diagnosis of post mandible fracture  The various methods of treatment have been discussed with the patient and family. After consideration of risks, benefits and other options for treatment, the patient has consented to  Procedure(s): REMOVAL OF MMF HARDWARE (N/A) as a surgical intervention .  The patient's history has been reviewed, patient examined, no change in status, stable for surgery.  I have reviewed the patient's chart and labs.  Questions were answered to the patient's satisfaction.     Nakeda Lebron

## 2014-08-20 NOTE — Anesthesia Postprocedure Evaluation (Signed)
Anesthesia Post Note  Patient: Charles Kennedy  Procedure(s) Performed: Procedure(s) (LRB): REMOVAL OF MMF HARDWARE (N/A)  Anesthesia type: general  Patient location: PACU  Post pain: Pain level controlled  Post assessment: Patient's Cardiovascular Status Stable  Last Vitals:  Filed Vitals:   08/20/14 1114  BP: 147/87  Pulse: 46  Temp: 36.5 C  Resp: 18    Post vital signs: Reviewed and stable  Level of consciousness: sedated  Complications: No apparent anesthesia complications

## 2014-08-20 NOTE — Anesthesia Preprocedure Evaluation (Signed)
Anesthesia Evaluation  Patient identified by MRN, date of birth, ID band Patient awake    Reviewed: Allergy & Precautions, H&P , NPO status , Patient's Chart, lab work & pertinent test results  Airway Mallampati: I  TM Distance: >3 FB Neck ROM: Full    Dental   Pulmonary Current Smoker,          Cardiovascular     Neuro/Psych    GI/Hepatic   Endo/Other    Renal/GU      Musculoskeletal   Abdominal   Peds  Hematology   Anesthesia Other Findings   Reproductive/Obstetrics                             Anesthesia Physical Anesthesia Plan  ASA: II  Anesthesia Plan: MAC   Post-op Pain Management:    Induction: Intravenous  Airway Management Planned: Mask  Additional Equipment:   Intra-op Plan:   Post-operative Plan:   Informed Consent: I have reviewed the patients History and Physical, chart, labs and discussed the procedure including the risks, benefits and alternatives for the proposed anesthesia with the patient or authorized representative who has indicated his/her understanding and acceptance.     Plan Discussed with: CRNA and Surgeon  Anesthesia Plan Comments:         Anesthesia Quick Evaluation

## 2014-08-20 NOTE — Discharge Instructions (Signed)
Maintain a soft diet for 1 week then you may resume eating anything. Rinse mouth with saltwater 3 times daily. Brush teeth normally.   Post Anesthesia Home Care Instructions  Activity: Get plenty of rest for the remainder of the day. A responsible adult should stay with you for 24 hours following the procedure.  For the next 24 hours, DO NOT: -Drive a car -Advertising copywriterperate machinery -Drink alcoholic beverages -Take any medication unless instructed by your physician -Make any legal decisions or sign important papers.  Meals: Start with liquid foods such as gelatin or soup. Progress to regular foods as tolerated. Avoid greasy, spicy, heavy foods. If nausea and/or vomiting occur, drink only clear liquids until the nausea and/or vomiting subsides. Call your physician if vomiting continues.  Special Instructions/Symptoms: Your throat may feel dry or sore from the anesthesia or the breathing tube placed in your throat during surgery. If this causes discomfort, gargle with warm salt water. The discomfort should disappear within 24 hours.

## 2014-08-20 NOTE — Op Note (Signed)
OPERATIVE REPORT  DATE OF SURGERY: 08/20/2014  PATIENT:  Corwin LevinsWilliam Gerads,  23 y.o. male   PRE-OPERATIVE DIAGNOSIS: Post mandible fracture  POST-OPERATIVE DIAGNOSIS: Post mandible fracture  PROCEDURE: Procedure(s): MANDIBULAR HARDWARE REMOVAL  SURGEON: Susy FrizzleJefry H Darral Rishel, MD  ASSISTANTS: None  ANESTHESIA: General   EBL: 30 ml  DRAINS: None  LOCAL MEDICATIONS USED: 1% xylocaine with epinephrine  SPECIMEN: none  COUNTS: Correct  PROCEDURE DETAILS: The patient was taken to the operating room and placed on the operating table in the supine position. Following induction of intravenous and inhalation anesthesia, the patient was draped in a standard fashion. The MMF was released by cutting the 4 wire loops and removing them. All of the upper and lower screws were identified and uncovered using a Therapist, nutritionalreer elevator and then removed. The arch bars were then removed. The occlusion looked excellent. Bleeding was controlled using gentle pressure. The pharynx and oral cavity were suctioned. Patient was awakened and transferred to recovery in stable condition.    PATIENT DISPOSITION: To PACU, stable

## 2014-08-20 NOTE — Transfer of Care (Signed)
Immediate Anesthesia Transfer of Care Note  Patient: Charles LevinsWilliam Kennedy  Procedure(s) Performed: Procedure(s): REMOVAL OF MMF HARDWARE (N/A)  Patient Location: PACU  Anesthesia Type:General  Level of Consciousness: sedated  Airway & Oxygen Therapy: Patient Spontanous Breathing and Patient connected to face mask oxygen  Post-op Assessment: Report given to PACU RN and Post -op Vital signs reviewed and stable  Post vital signs: Reviewed and stable  Complications: No apparent anesthesia complications

## 2014-08-23 ENCOUNTER — Encounter (HOSPITAL_BASED_OUTPATIENT_CLINIC_OR_DEPARTMENT_OTHER): Payer: Self-pay | Admitting: Otolaryngology
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
